# Patient Record
Sex: Female | Born: 1990 | Race: Black or African American | Hispanic: No | Marital: Single | Smoking: Never smoker
Health system: Southern US, Community
[De-identification: ages and names within clinical notes are randomized; demographics above are authoritative.]

## PROBLEM LIST (undated history)

## (undated) ENCOUNTER — Inpatient Hospital Stay (HOSPITAL_COMMUNITY): Payer: Self-pay

## (undated) DIAGNOSIS — J45909 Unspecified asthma, uncomplicated: Secondary | ICD-10-CM

## (undated) DIAGNOSIS — D649 Anemia, unspecified: Secondary | ICD-10-CM

## (undated) DIAGNOSIS — R002 Palpitations: Secondary | ICD-10-CM

## (undated) DIAGNOSIS — L709 Acne, unspecified: Secondary | ICD-10-CM

## (undated) DIAGNOSIS — N159 Renal tubulo-interstitial disease, unspecified: Secondary | ICD-10-CM

## (undated) HISTORY — DX: Unspecified asthma, uncomplicated: J45.909

## (undated) HISTORY — DX: Renal tubulo-interstitial disease, unspecified: N15.9

## (undated) HISTORY — DX: Anemia, unspecified: D64.9

## (undated) HISTORY — DX: Palpitations: R00.2

## (undated) HISTORY — DX: Acne, unspecified: L70.9

## (undated) HISTORY — PX: DENTAL SURGERY: SHX609

---

## 2013-06-07 ENCOUNTER — Encounter (HOSPITAL_COMMUNITY): Payer: Self-pay | Admitting: Emergency Medicine

## 2013-06-07 ENCOUNTER — Emergency Department (HOSPITAL_COMMUNITY)
Admission: EM | Admit: 2013-06-07 | Discharge: 2013-06-08 | Disposition: A | Discharge status: Home / Self Care | Payer: Medicaid Other | Attending: Emergency Medicine | Admitting: Emergency Medicine

## 2013-06-07 ENCOUNTER — Emergency Department (HOSPITAL_COMMUNITY): Payer: Medicaid Other

## 2013-06-07 DIAGNOSIS — O9989 Other specified diseases and conditions complicating pregnancy, childbirth and the puerperium: Secondary | ICD-10-CM | POA: Insufficient documentation

## 2013-06-07 DIAGNOSIS — M25562 Pain in left knee: Secondary | ICD-10-CM

## 2013-06-07 DIAGNOSIS — X500XXA Overexertion from strenuous movement or load, initial encounter: Secondary | ICD-10-CM | POA: Insufficient documentation

## 2013-06-07 DIAGNOSIS — S99929A Unspecified injury of unspecified foot, initial encounter: Secondary | ICD-10-CM

## 2013-06-07 DIAGNOSIS — S3981XA Other specified injuries of abdomen, initial encounter: Secondary | ICD-10-CM | POA: Insufficient documentation

## 2013-06-07 DIAGNOSIS — S8990XA Unspecified injury of unspecified lower leg, initial encounter: Secondary | ICD-10-CM | POA: Insufficient documentation

## 2013-06-07 DIAGNOSIS — Z3490 Encounter for supervision of normal pregnancy, unspecified, unspecified trimester: Secondary | ICD-10-CM

## 2013-06-07 DIAGNOSIS — Y9389 Activity, other specified: Secondary | ICD-10-CM | POA: Insufficient documentation

## 2013-06-07 DIAGNOSIS — W010XXA Fall on same level from slipping, tripping and stumbling without subsequent striking against object, initial encounter: Secondary | ICD-10-CM | POA: Insufficient documentation

## 2013-06-07 DIAGNOSIS — S99919A Unspecified injury of unspecified ankle, initial encounter: Secondary | ICD-10-CM

## 2013-06-07 DIAGNOSIS — R1032 Left lower quadrant pain: Secondary | ICD-10-CM

## 2013-06-07 DIAGNOSIS — Y929 Unspecified place or not applicable: Secondary | ICD-10-CM | POA: Insufficient documentation

## 2013-06-07 DIAGNOSIS — O210 Mild hyperemesis gravidarum: Secondary | ICD-10-CM | POA: Insufficient documentation

## 2013-06-07 NOTE — ED Notes (Signed)
Patient states she purposefully fell onto her left side to avoid falling forward due her pregnancy. She now complains of pain to her left side with or without movement. She has not had an obgyn visit since this incident.

## 2013-06-07 NOTE — ED Notes (Signed)
Patient state that she feel on Saturday  - she has left lower quad pain and left lower extremity pain. The ptient has swelling noted to her left knee. Patient is [redacted] weeks pregnant

## 2013-06-07 NOTE — ED Provider Notes (Signed)
CSN: [id number]     Arrival date & time 06/07/13  2151 History   First MD Initiated Contact with Patient 06/07/13 2242     Chief Complaint  Patient presents with  . Leg Pain   (Consider location/radiation/quality/duration/timing/severity/associated sxs/prior Treatment) HPI Comments: Mercedes Kelley is a 23 year-old female with a past medical history of G1P0 currently 11 weeks by hcg test performed at patient's work, presenting the Emergency Department with a chief complaint of left knee pain and left abdominal pain due to a fall 3 days ago.  The patient reports two separate falls while on a workout with the [employer].  She reports twisting her knee to avoid falling on her stomach and fell backward the first time and on her left side the second fall.  She reports she fell due to slipping on the frost on the grass.  She reports left-sided abdominal discomfort onset later that evening.  She describes the pain as intermittent dull pain, worsened with pressure to the area or lying on the left side. She denies abnormal discharge or vaginal bleeding.  She reports nausea unchanged in the past week due to the pregnancy. She denies diarrhea or constipation.   LMP:03/22/2013   Patient is a 23 y.o. female presenting with leg pain. The history is provided by the patient and medical records.  Leg Pain Associated symptoms: no fever     History reviewed. No pertinent past medical history. History reviewed. No pertinent past surgical history. History reviewed. No pertinent family history. History  Substance Use Topics  . Smoking status: Never Smoker   . Smokeless tobacco: Not on file  . Alcohol Use: No   OB History   Grav Para Term Preterm Abortions TAB SAB Ect Mult Living   1              Review of Systems  Constitutional: Negative for fever and chills.  Gastrointestinal: Positive for nausea, vomiting and abdominal pain. Negative for diarrhea, constipation and blood in stool.  Genitourinary: Negative  for dysuria, frequency, hematuria, flank pain, vaginal bleeding and vaginal discharge.  Neurological: Negative for syncope and light-headedness.    Allergies  Review of patient's allergies indicates no known allergies.  Home Medications   Current Outpatient Rx  Name  Route  Sig  Dispense  Refill  . acetaminophen (TYLENOL) 325 MG tablet   Oral   Take 650 mg by mouth every 6 (six) hours as needed for headache.          BP 119/69  Pulse 84  Temp(Src) 98.9 F (37.2 C) (Oral)  Resp 16  SpO2 99% Physical Exam  Nursing note and vitals reviewed. Constitutional: She is oriented to person, place, and time. She appears well-developed and well-nourished.  HENT:  Head: Normocephalic.  Eyes: No scleral icterus.  Neck: Neck supple.  Cardiovascular: Normal rate and regular rhythm.   No murmur heard. Pulmonary/Chest: Effort normal and breath sounds normal. No respiratory distress. She has no wheezes. She has no rales.  Abdominal: Soft. Bowel sounds are normal. She exhibits no distension. There is tenderness in the left lower quadrant. There is rigidity. There is no guarding and no CVA tenderness.    Genitourinary: Uterus is not tender. Cervix exhibits no motion tenderness. Right adnexum displays no tenderness. Left adnexum displays tenderness.  Cervical os closed, no blood in vaginal vault.    Musculoskeletal:       Left knee: She exhibits normal range of motion, no deformity, no erythema, normal alignment, no LCL  laxity, normal patellar mobility and no MCL laxity. Tenderness found. Medial joint line tenderness noted.       Legs: Neurological: She is alert and oriented to person, place, and time.  Skin: Skin is warm and dry.  Psychiatric: She has a normal mood and affect. Her behavior is normal.    ED Course  Procedures (including critical care time) Labs Review Labs Reviewed  HCG, QUANTITATIVE, PREGNANCY - Abnormal; Notable for the following:    hCG, Beta Chain, Quant, [place] [id number] (*)     All other components within normal limits   Imaging Review [place] Ob Comp Less 14 Wks  06/08/2013   CLINICAL DATA:  23 year old G1, LMP 03/22/2013 (11 weeks 1 day), presenting with a left lower quadrant abdominal pain and left pelvic pain after a fall.  EXAM: OBSTETRIC <14 WK ULTRASOUND  TECHNIQUE: Transabdominal ultrasound was performed for evaluation of the gestation as well as the maternal uterus and adnexal regions.  COMPARISON:  None.  FINDINGS: Intrauterine gestational sac: Single, normal in appearance.  Yolk sac:  Not visualized.  Embryo:  Visualized.  Cardiac Activity: Visualized.  Heart Rate: 157 bpm  CRL:   39 mm   10 w 6 d                  US EDC: 12/29/2013.  Maternal uterus/adnexae: No subchorionic hemorrhage. Thickening of the posterior wall of the body of the uterus which change configuration during the examination is consistent with a contraction. Ovaries not visualized due to bowel gas. No visible adnexal masses or free pelvic fluid.  IMPRESSION: 1. Single live intrauterine fetus with estimated gestational age [redacted] weeks 6 days by crown-rump length. This correlates well with the estimated gestational age of [redacted] weeks 1 day by LMP. [facility] 2. Contraction involving the posterior wall of the body of the uterus during the examination. 3. Nonvisualization of the ovaries. No adnexal masses or free pelvic fluid.   Electronically Signed   By: Hulan Saashomas  Lawrence M.D.   On: 06/08/2013 01:20   Dg Knee Complete 4 Views Left  06/08/2013   CLINICAL DATA:  Leg pain after trauma  EXAM: LEFT KNEE - COMPLETE 4+ VIEW  COMPARISON:  None.  FINDINGS: There is no evidence of fracture, dislocation, or joint effusion. There is no evidence of arthropathy or other focal bone abnormality. Soft tissues are unremarkable.  IMPRESSION: Negative.   Electronically Signed   By: Tiburcio PeaJonathan  Watts M.D.   On: 06/08/2013 00:47    EKG Interpretation   None       MDM   1. Knee pain, left   2. Intrauterine  pregnancy   3. Abdominal pain, left lower quadrant     Pt presents with left knee pain,Tenderness to medial joint line, no crepitus no obvious deformity, no swelling. Likely meniscal. Will XR to evaluate.  LLQ discomfort without previous OB evaluation,. Pelvic shows minimal left adnexa tenderness. US to rule out ectopic. XR: without fracture or effusion.  US shows 10 week 6 day intrauterine pregnancy with FHT 157 bpm. Discussed lab results, imaging results, and treatment plan with the patient. Return precautions given. Reports understanding and no other concerns at this time.  Patient is stable for discharge at this time.       Clabe SealLauren M Ani Deoliveira, PA-C 06/08/13 2159

## 2013-06-07 NOTE — ED Notes (Signed)
Patient transported to X-ray 

## 2013-06-08 ENCOUNTER — Emergency Department (HOSPITAL_COMMUNITY): Payer: Medicaid Other

## 2013-06-08 LAB — HCG, QUANTITATIVE, PREGNANCY: HCG, BETA CHAIN, QUANT, S: 43033 m[IU]/mL — AB (ref <5)

## 2013-06-08 NOTE — Discharge Instructions (Signed)
Call for a follow up appointment with a Family or Primary Care Provider.  Return if Symptoms worsen.   Take medication as prescribed.  Take tylenol for pain.  Rest your leg, crutches for pain.  Follow up with your OB/GYN in two weeks.

## 2013-06-10 NOTE — ED Provider Notes (Signed)
Medical screening examination/treatment/procedure(s) were performed by non-physician practitioner and as supervising physician I was immediately available for consultation/collaboration.  EKG Interpretation   None         Audree CamelScott T Harleen Fineberg, MD 06/10/13 [phone]

## 2013-06-23 ENCOUNTER — Other Ambulatory Visit: Payer: Self-pay

## 2013-07-19 ENCOUNTER — Encounter: Payer: Self-pay | Admitting: Cardiovascular Disease

## 2013-07-19 ENCOUNTER — Encounter: Payer: Self-pay | Admitting: *Deleted

## 2013-07-19 ENCOUNTER — Ambulatory Visit: Payer: Self-pay | Admitting: Cardiovascular Disease

## 2013-07-19 DIAGNOSIS — N159 Renal tubulo-interstitial disease, unspecified: Secondary | ICD-10-CM | POA: Insufficient documentation

## 2013-07-19 DIAGNOSIS — L709 Acne, unspecified: Secondary | ICD-10-CM | POA: Insufficient documentation

## 2013-07-19 DIAGNOSIS — J45909 Unspecified asthma, uncomplicated: Secondary | ICD-10-CM | POA: Insufficient documentation

## 2013-07-19 DIAGNOSIS — D649 Anemia, unspecified: Secondary | ICD-10-CM | POA: Insufficient documentation

## 2013-07-19 DIAGNOSIS — R002 Palpitations: Secondary | ICD-10-CM | POA: Insufficient documentation

## 2013-07-26 ENCOUNTER — Inpatient Hospital Stay (HOSPITAL_COMMUNITY)
Admission: AD | Admit: 2013-07-26 | Discharge: 2013-07-26 | Disposition: A | Discharge status: Home / Self Care | Payer: Medicaid Other | Source: Ambulatory Visit | Attending: Obstetrics & Gynecology | Admitting: Obstetrics & Gynecology

## 2013-07-26 ENCOUNTER — Encounter (HOSPITAL_COMMUNITY): Payer: Self-pay | Admitting: *Deleted

## 2013-07-26 DIAGNOSIS — N390 Urinary tract infection, site not specified: Secondary | ICD-10-CM

## 2013-07-26 DIAGNOSIS — O368190 Decreased fetal movements, unspecified trimester, not applicable or unspecified: Secondary | ICD-10-CM | POA: Insufficient documentation

## 2013-07-26 DIAGNOSIS — K219 Gastro-esophageal reflux disease without esophagitis: Secondary | ICD-10-CM | POA: Insufficient documentation

## 2013-07-26 DIAGNOSIS — O2390 Unspecified genitourinary tract infection in pregnancy, unspecified trimester: Secondary | ICD-10-CM | POA: Insufficient documentation

## 2013-07-26 DIAGNOSIS — R109 Unspecified abdominal pain: Secondary | ICD-10-CM | POA: Insufficient documentation

## 2013-07-26 LAB — URINALYSIS, ROUTINE W REFLEX MICROSCOPIC
BILIRUBIN URINE: NEGATIVE
Glucose, UA: NEGATIVE mg/dL
HGB URINE DIPSTICK: NEGATIVE
KETONES UR: NEGATIVE mg/dL
Leukocytes, UA: NEGATIVE
NITRITE: POSITIVE — AB
Protein, ur: NEGATIVE mg/dL
Specific Gravity, Urine: 1.025 (ref 1.005–1.030)
UROBILINOGEN UA: 0.2 mg/dL (ref 0.0–1.0)
pH: 6 (ref 5.0–8.0)

## 2013-07-26 LAB — URINE MICROSCOPIC-ADD ON

## 2013-07-26 MED ORDER — RANITIDINE HCL 150 MG PO TABS: 150.0000 mg | ORAL_TABLET | Freq: Two times a day (BID) | ORAL | Status: AC

## 2013-07-26 MED ORDER — NITROFURANTOIN MONOHYD MACRO 100 MG PO CAPS: 100.0000 mg | ORAL_CAPSULE | Freq: Two times a day (BID) | ORAL | Status: DC

## 2013-07-26 NOTE — Progress Notes (Signed)
Pt states she has headache also most every other day and  She had one yesterday.

## 2013-07-26 NOTE — MAU Note (Signed)
Pt states she went jogging yesterday and is not feeling the baby move today. Pt states she usually feels the baby move all the time. Pt states she drank something"sugary" and also had coffee. Pt is complaining of pain to the upper part of her abomen and the right side.

## 2013-07-26 NOTE — MAU Note (Signed)
Pt went jogging yesterday, had not run since January.  Has not felt FM since then.  RLQ & upper abd pain since this a.m.  Denies bleeding.

## 2013-07-26 NOTE — Progress Notes (Signed)
Pt states she fell in January

## 2013-07-26 NOTE — MAU Provider Note (Signed)
History     CSN: [id number]  Arrival date and time: 07/26/13 1758   First Provider Initiated Contact with Patient 07/26/13 1929      Chief Complaint  Patient presents with  . Decreased Fetal Movement  . Abdominal Pain   HPI  Mercedes Kelley is a 23 y.o. female G1P0 at [redacted]w[redacted]d who presents to MAU with complaints of decreased fetal movement. She first started noticing fetal movements when she was 15 weeks. She denies bleeding or leaking of fluid. Pt has pain in her upper and lower abdomen R>L. She has been experiencing problems with acid reflux and was unsure what she could take in pregnancy.    OB History   Grav Para Term Preterm Abortions TAB SAB Ect Mult Living   1               Past Medical History  Diagnosis Date  . Anemia   . Acne   . Kidney infection   . Asthma   . Palpitations     Past Surgical History  Procedure Laterality Date  . Dental surgery      Family History  Problem Relation Age of Onset  . Hypertension Mother   . Thyroid disease Mother   . Hypertension Father     History  Substance Use Topics  . Smoking status: Never Smoker   . Smokeless tobacco: Not on file  . Alcohol Use: No    Allergies: No Known Allergies  Prescriptions prior to admission  Medication Sig Dispense Refill  . acetaminophen (TYLENOL) 325 MG tablet Take 650 mg by mouth every 6 (six) hours as needed for headache.       Results for orders placed during the hospital encounter of 07/26/13 (from the past 48 hour(s))  URINALYSIS, ROUTINE W REFLEX MICROSCOPIC     Status: Abnormal   Collection Time    07/26/13  6:15 PM      Result Value Ref Range   Color, Urine YELLOW  YELLOW   APPearance CLEAR  CLEAR   Specific Gravity, Urine 1.025  1.005 - 1.030   pH 6.0  5.0 - 8.0   Glucose, UA NEGATIVE  NEGATIVE mg/dL   Hgb urine dipstick NEGATIVE  NEGATIVE   Bilirubin Urine NEGATIVE  NEGATIVE   Ketones, ur NEGATIVE  NEGATIVE mg/dL   Protein, ur NEGATIVE  NEGATIVE mg/dL   Urobilinogen, UA 0.2  0.0 - 1.0 mg/dL   Nitrite POSITIVE (*) NEGATIVE   Leukocytes, UA NEGATIVE  NEGATIVE  URINE MICROSCOPIC-ADD ON     Status: Abnormal   Collection Time    07/26/13  6:15 PM      Result Value Ref Range   Squamous Epithelial / LPF RARE  RARE   WBC, UA 7-10  <3 WBC/hpf   RBC / HPF 0-2  <3 RBC/hpf   Bacteria, UA MANY (*) RARE    Review of Systems  Constitutional: Negative for fever and chills.  Gastrointestinal: Positive for heartburn (patient has been propping herself up at night to sleep ), abdominal pain and diarrhea (2 episodes yesterday; none today). Negative for nausea and vomiting.  Genitourinary: Negative for dysuria, urgency and frequency.   Physical Exam   Blood pressure 124/72, pulse 72, temperature 98.8 F (37.1 C), temperature source Oral, resp. rate 18.  Physical Exam  Constitutional: She is oriented to person, place, and time. She appears well-developed and well-nourished. No distress.  HENT:  Head: Normocephalic.  Eyes: Pupils are equal, round, and reactive to light.  Neck: Neck supple.  Respiratory: Effort normal.  GI: Soft. She exhibits no distension and no mass. There is tenderness (+Upper- mid abdominal pain ). There is no rebound and no guarding.  Musculoskeletal: Normal range of motion.  Neurological: She is alert and oriented to person, place, and time.  Skin: Skin is warm. She is not diaphoretic.  Psychiatric: Her behavior is normal.    MAU Course  Procedures None  MDM +fht UA  Consulted with Dr. Mora ApplPinn; ok to discharge patient home.   Assessment and Plan   A:  GERD Change in fetal movements; + fht UTI  P:  Discharge home in stable condition RX: Zantac 150 mg BID        Macrobid Follow up in the office as scheduled Return to MAU as needed, if symptoms worsen   Iona HansenJennifer Irene Braian Tijerina, NP  07/26/2013, 7:42 PM

## 2013-07-28 NOTE — MAU Provider Note (Signed)
I agree with above 

## 2013-10-21 ENCOUNTER — Encounter (HOSPITAL_COMMUNITY): Payer: Self-pay | Admitting: *Deleted

## 2013-10-21 ENCOUNTER — Inpatient Hospital Stay (HOSPITAL_COMMUNITY)
Admission: AD | Admit: 2013-10-21 | Discharge: 2013-10-21 | Disposition: A | Discharge status: Home / Self Care | Payer: Medicaid Other | Source: Ambulatory Visit | Attending: Obstetrics and Gynecology | Admitting: Obstetrics and Gynecology

## 2013-10-21 DIAGNOSIS — O2390 Unspecified genitourinary tract infection in pregnancy, unspecified trimester: Secondary | ICD-10-CM | POA: Insufficient documentation

## 2013-10-21 DIAGNOSIS — R002 Palpitations: Secondary | ICD-10-CM | POA: Insufficient documentation

## 2013-10-21 DIAGNOSIS — R109 Unspecified abdominal pain: Secondary | ICD-10-CM | POA: Insufficient documentation

## 2013-10-21 DIAGNOSIS — O26899 Other specified pregnancy related conditions, unspecified trimester: Secondary | ICD-10-CM

## 2013-10-21 DIAGNOSIS — N390 Urinary tract infection, site not specified: Secondary | ICD-10-CM | POA: Insufficient documentation

## 2013-10-21 LAB — URINALYSIS, ROUTINE W REFLEX MICROSCOPIC
Bilirubin Urine: NEGATIVE
Glucose, UA: NEGATIVE mg/dL
HGB URINE DIPSTICK: NEGATIVE
Ketones, ur: NEGATIVE mg/dL
Nitrite: POSITIVE — AB
Protein, ur: NEGATIVE mg/dL
SPECIFIC GRAVITY, URINE: 1.01 (ref 1.005–1.030)
Urobilinogen, UA: 0.2 mg/dL (ref 0.0–1.0)
pH: 6 (ref 5.0–8.0)

## 2013-10-21 LAB — URINE MICROSCOPIC-ADD ON

## 2013-10-21 MED ORDER — NITROFURANTOIN MONOHYD MACRO 100 MG PO CAPS: 100.0000 mg | ORAL_CAPSULE | Freq: Two times a day (BID) | ORAL | Status: AC

## 2013-10-21 NOTE — MAU Provider Note (Signed)
History     CSN: [id number]  Arrival date and time: 10/21/13 1730   None     Chief Complaint  Patient presents with  . Contractions   HPI  Mercedes Kelley is a 23 y.o. female G1P0 at 4484w3d who presents with tightening of her upper abdomen and cramping in her bilateral lower abdomen. The symptoms started yesterday and feels that the pain is better today. The patient states she has done a lot of walking today and feels like the pain is worse when she changes from a sitting position to a standing position. Pt is in active duty and for the last 2 days she has been outside in the heat a lot.     OB History   Grav Para Term Preterm Abortions TAB SAB Ect Mult Living   1               Past Medical History  Diagnosis Date  . Anemia   . Acne   . Kidney infection   . Asthma   . Palpitations     Past Surgical History  Procedure Laterality Date  . Dental surgery      Family History  Problem Relation Age of Onset  . Hypertension Mother   . Thyroid disease Mother   . Hypertension Father     History  Substance Use Topics  . Smoking status: Never Smoker   . Smokeless tobacco: Not on file  . Alcohol Use: No    Allergies: No Known Allergies  Prescriptions prior to admission  Medication Sig Dispense Refill  . acetaminophen (TYLENOL) 500 MG tablet Take 500 mg by mouth every 6 (six) hours as needed for headache.      . ferrous sulfate 325 (65 FE) MG tablet Take 325 mg by mouth daily with breakfast.      . Prenatal Vit-Min-FA-Fish Oil ([employer] PRENATAL GUMMY PO) Take 1 tablet by mouth 2 (two) times daily.      . ranitidine (ZANTAC) 150 MG tablet Take 1 tablet (150 mg total) by mouth 2 (two) times daily.  60 tablet  0  . albuterol (PROVENTIL HFA;VENTOLIN HFA) 108 (90 BASE) MCG/ACT inhaler Inhale 1-2 puffs into the lungs every 6 (six) hours as needed for wheezing or shortness of breath.       No results found for this or any previous visit (from the past 48 hour(s)).  Results  for orders placed during the hospital encounter of 10/21/13 (from the past 48 hour(s))  URINALYSIS, ROUTINE W REFLEX MICROSCOPIC     Status: Abnormal   Collection Time    10/21/13  6:55 PM      Result Value Ref Range   Color, Urine YELLOW  YELLOW   APPearance CLEAR  CLEAR   Specific Gravity, Urine 1.010  1.005 - 1.030   pH 6.0  5.0 - 8.0   Glucose, UA NEGATIVE  NEGATIVE mg/dL   Hgb urine dipstick NEGATIVE  NEGATIVE   Bilirubin Urine NEGATIVE  NEGATIVE   Ketones, ur NEGATIVE  NEGATIVE mg/dL   Protein, ur NEGATIVE  NEGATIVE mg/dL   Urobilinogen, UA 0.2  0.0 - 1.0 mg/dL   Nitrite POSITIVE (*) NEGATIVE   Leukocytes, UA TRACE (*) NEGATIVE  URINE MICROSCOPIC-ADD ON     Status: Abnormal   Collection Time    10/21/13  6:55 PM      Result Value Ref Range   Squamous Epithelial / LPF FEW (*) RARE   WBC, UA 7-10  <3 WBC/hpf  RBC / HPF 3-6  <3 RBC/hpf   Bacteria, UA MANY (*) RARE    Review of Systems  Constitutional: Negative for fever and chills.  Gastrointestinal: Positive for nausea and abdominal pain (+ Upper abdominal tightening and bilateral lower abdominal cramping ). Negative for vomiting, diarrhea and constipation.  Genitourinary: Negative for dysuria, urgency, frequency and hematuria.       No vaginal discharge; clear, does not have to wear a pad. No vaginal bleeding. No dysuria.   Musculoskeletal: Positive for back pain.   Physical Exam   Blood pressure 127/76, pulse 90, temperature 99.2 F (37.3 C), temperature source Oral, resp. rate 20.  Physical Exam  Constitutional: She is oriented to person, place, and time. She appears well-developed and well-nourished.  Non-toxic appearance. She does not have a sickly appearance. She does not appear ill. No distress.  HENT:  Head: Normocephalic.  Eyes: Pupils are equal, round, and reactive to light.  Neck: Neck supple.  Respiratory: Effort normal.  GI: Soft. She exhibits no distension. There is no tenderness. There is no  rebound and no guarding.  Genitourinary:  Speculum exam: Vagina - Small amount of creamy, white discharge, no odor Cervix - No contact bleeding, no active bleeding  Bimanual exam: +suprapubic tenderness  Cervix closed, thick, posterior  Chaperone present for exam.   Musculoskeletal: Normal range of motion.  Neurological: She is alert and oriented to person, place, and time.  Skin: Skin is warm. She is not diaphoretic.  Psychiatric: Her behavior is normal.    MAU Course  Procedures None  MDM  Fetal Tracing: Baseline: 140 bpm  Variability: Moderate Accelerations: 15x15 Decelerations: none Toco: No contractions noted   UA NST  Urine culture pending Discussed patient with Dr. Tenny Craw. Will treat patient for UTI and culture her urine.    Assessment and Plan   A:  1. Abdominal pain in pregnancy   2. UTI (lower urinary tract infection)    P:  Discharge home in stable condition Change positions slowly RX: Macrobid Increase PO fluid intake  Follow up with PCP as scheduled.  Iona Hansen Korin Hartwell, NP  10/21/2013, 7:53 PM

## 2013-10-21 NOTE — MAU Note (Signed)
Been having ? Contractions today.  Increased pressure and cramping.

## 2013-10-21 NOTE — Discharge Instructions (Signed)
Abdominal Pain During Pregnancy °Abdominal pain is common in pregnancy. Most of the time, it does not cause harm. There are many causes of abdominal pain. Some causes are more serious than others. Some of the causes of abdominal pain in pregnancy are easily diagnosed. Occasionally, the diagnosis takes time to understand. Other times, the cause is not determined. Abdominal pain can be a sign that something is very wrong with the pregnancy, or the pain may have nothing to do with the pregnancy at all. For this reason, always tell your health care provider if you have any abdominal discomfort. °HOME CARE INSTRUCTIONS  °Monitor your abdominal pain for any changes. The following actions may help to alleviate any discomfort you are experiencing: °· Do not have sexual intercourse or put anything in your vagina until your symptoms go away completely. °· Get plenty of rest until your pain improves. °· Drink clear fluids if you feel nauseous. Avoid solid food as long as you are uncomfortable or nauseous. °· Only take over-the-counter or prescription medicine as directed by your health care provider. °· Keep all follow-up appointments with your health care provider. °SEEK IMMEDIATE MEDICAL CARE IF: °· You are bleeding, leaking fluid, or passing tissue from the vagina. °· You have increasing pain or cramping. °· You have persistent vomiting. °· You have painful or bloody urination. °· You have a fever. °· You notice a decrease in your baby's movements. °· You have extreme weakness or feel faint. °· You have shortness of breath, with or without abdominal pain. °· You develop a severe headache with abdominal pain. °· You have abnormal vaginal discharge with abdominal pain. °· You have persistent diarrhea. °· You have abdominal pain that continues even after rest, or gets worse. °MAKE SURE YOU:  °· Understand these instructions. °· Will watch your condition. °· Will get help right away if you are not doing well or get  worse. °Document Released: 05/12/2005 Document Revised: 03/02/2013 Document Reviewed: 12/09/2012 °[employer]® Patient Information ©2014 [employer]. ° °

## 2013-10-24 LAB — URINE CULTURE: Colony Count: >=100000

## 2014-03-27 ENCOUNTER — Encounter (HOSPITAL_COMMUNITY): Payer: Self-pay | Admitting: *Deleted

## 2014-08-14 IMAGING — CR DG KNEE COMPLETE 4+V*L*
4 series · 4 of 4 positions shown · non-contrast
Comparison: None.

CLINICAL DATA: Leg pain after trauma

EXAM:
LEFT KNEE - COMPLETE 4+ VIEW

[t knee ap left]
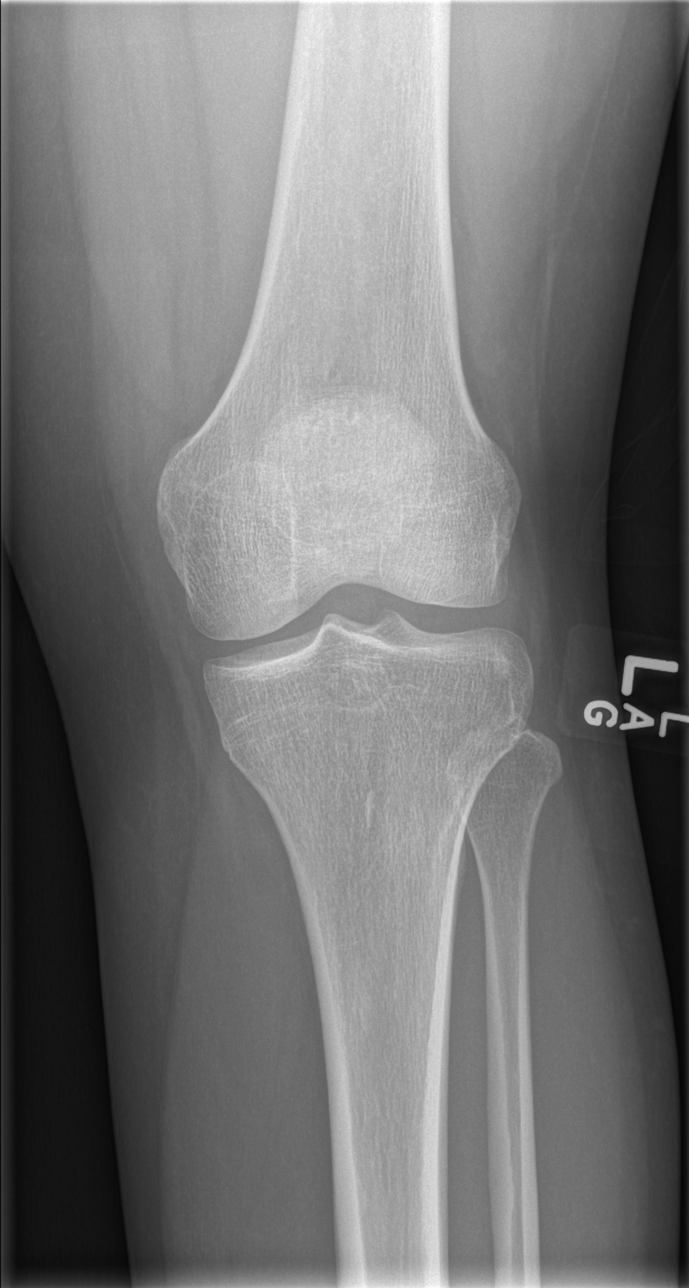

[t knee obl left (1 of 2)]
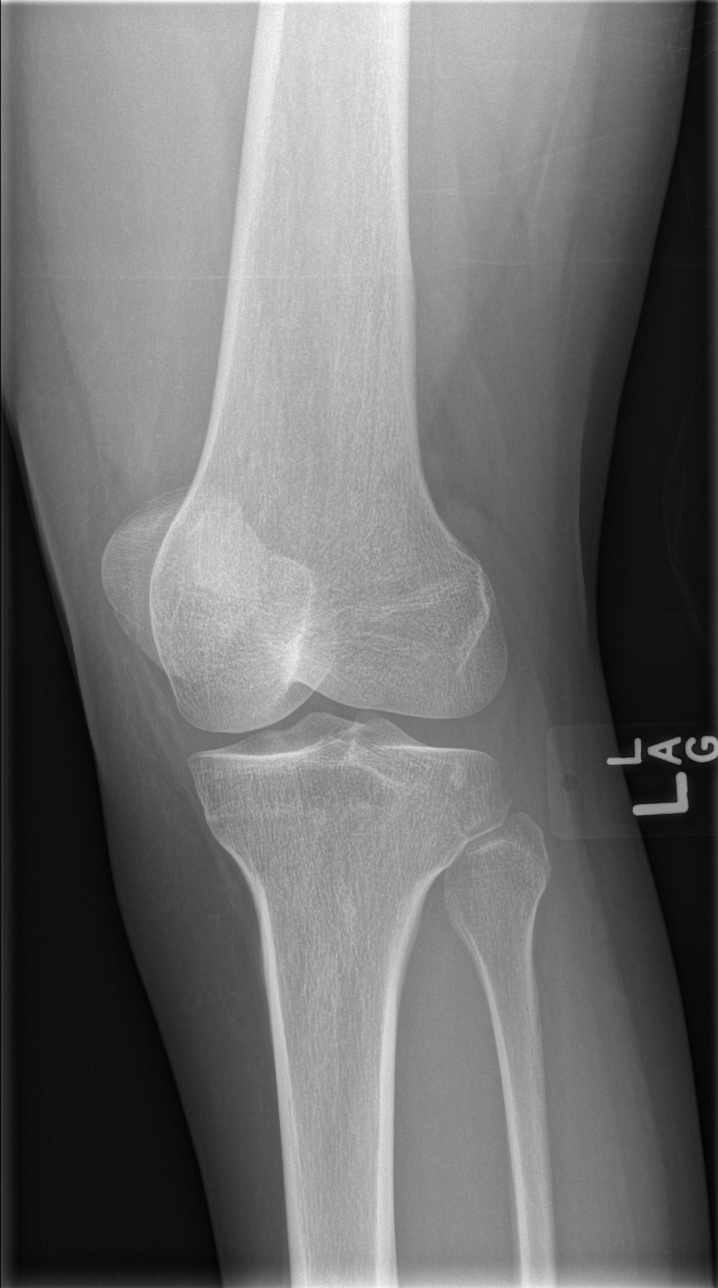

[t knee obl left (2 of 2)]
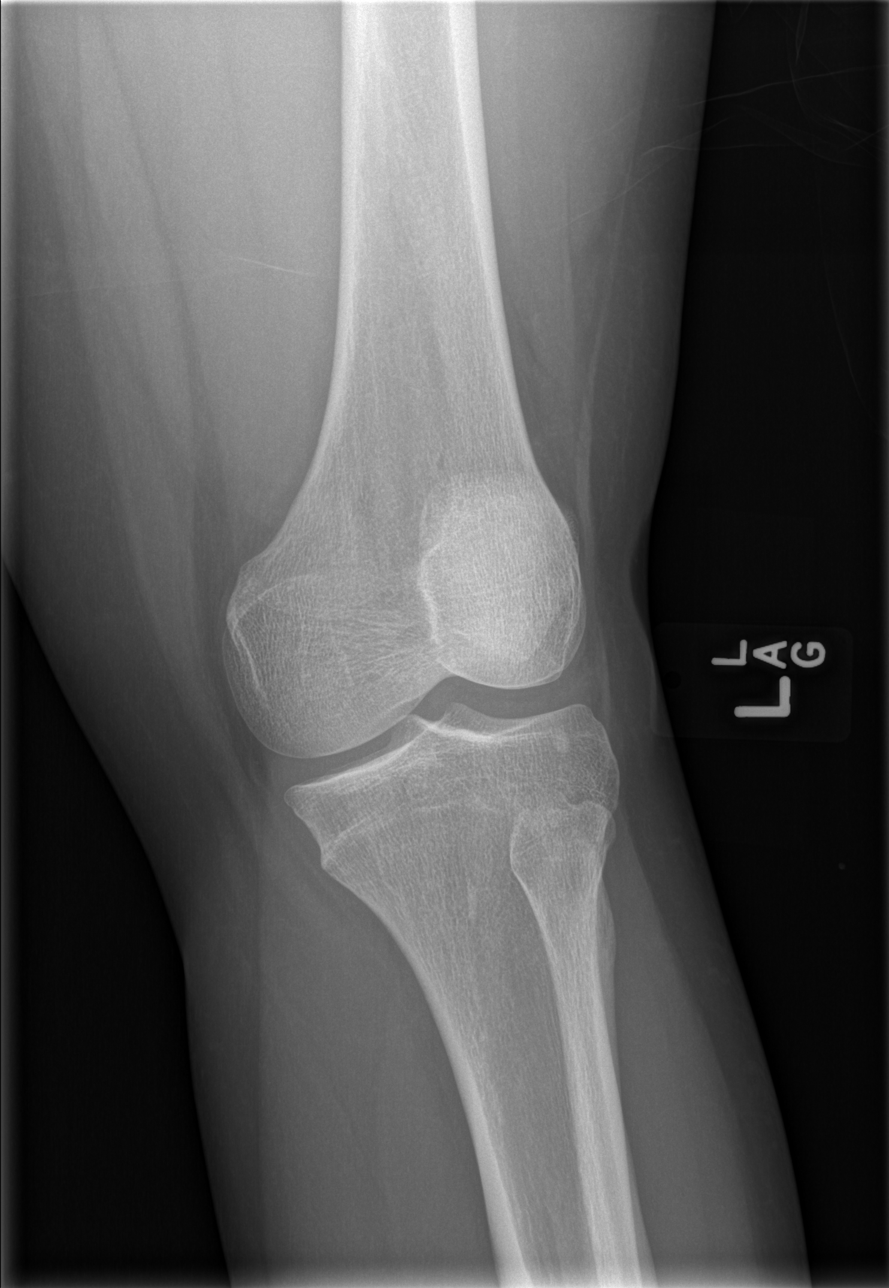

[t knee lat left]
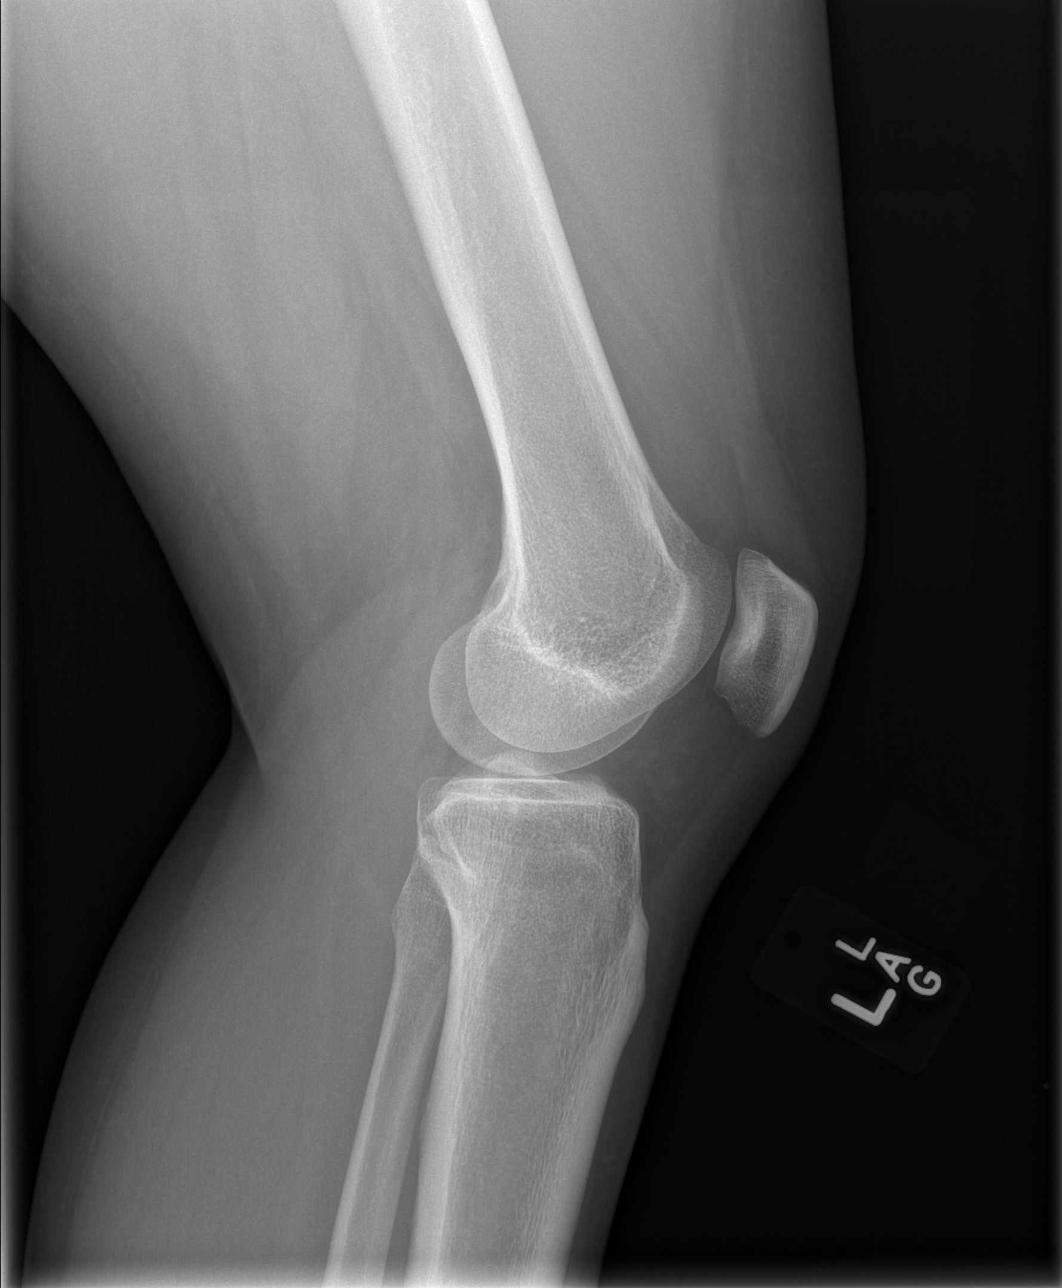

[4 of 4 positions shown; findings below may reference images not displayed]

FINDINGS: There is no evidence of fracture, dislocation, or joint effusion.
There is no evidence of arthropathy or other focal bone abnormality.
Soft tissues are unremarkable.
IMPRESSION: Negative.

## 2014-08-26 ENCOUNTER — Encounter (HOSPITAL_COMMUNITY): Payer: Self-pay | Admitting: *Deleted
